# Patient Record
Sex: Female | Born: 1970 | Race: White | Hispanic: No | Smoking: Never smoker
Health system: Southern US, Community
[De-identification: ages and names within clinical notes are randomized; demographics above are authoritative.]

## PROBLEM LIST (undated history)

## (undated) DIAGNOSIS — I10 Essential (primary) hypertension: Secondary | ICD-10-CM

## (undated) DIAGNOSIS — E039 Hypothyroidism, unspecified: Secondary | ICD-10-CM

## (undated) DIAGNOSIS — M069 Rheumatoid arthritis, unspecified: Secondary | ICD-10-CM

## (undated) DIAGNOSIS — E119 Type 2 diabetes mellitus without complications: Secondary | ICD-10-CM

## (undated) HISTORY — DX: Type 2 diabetes mellitus without complications: E11.9

## (undated) HISTORY — DX: Hypothyroidism, unspecified: E03.9

## (undated) HISTORY — DX: Essential (primary) hypertension: I10

## (undated) HISTORY — DX: Rheumatoid arthritis, unspecified: M06.9

---

## 2011-10-27 ENCOUNTER — Ambulatory Visit: Payer: Self-pay | Admitting: Family Medicine

## 2011-10-27 DIAGNOSIS — E669 Obesity, unspecified: Secondary | ICD-10-CM

## 2011-10-27 DIAGNOSIS — E039 Hypothyroidism, unspecified: Secondary | ICD-10-CM

## 2011-10-27 DIAGNOSIS — F419 Anxiety disorder, unspecified: Secondary | ICD-10-CM | POA: Insufficient documentation

## 2011-10-27 DIAGNOSIS — I10 Essential (primary) hypertension: Secondary | ICD-10-CM

## 2011-10-27 DIAGNOSIS — G8929 Other chronic pain: Secondary | ICD-10-CM

## 2011-10-27 MED ORDER — ALPRAZOLAM 1 MG PO TABS: 1.0000 mg | ORAL_TABLET | Freq: Two times a day (BID) | ORAL | Status: DC | PRN

## 2011-10-27 MED ORDER — OXYCODONE HCL 30 MG PO TABS: 30.0000 mg | ORAL_TABLET | ORAL | Status: AC | PRN

## 2011-10-27 MED ORDER — LEVOTHYROXINE SODIUM 125 MCG PO TABS: 125.0000 ug | ORAL_TABLET | Freq: Every day | ORAL | Status: DC

## 2011-10-27 MED ORDER — LISINOPRIL 40 MG PO TABS: 40.0000 mg | ORAL_TABLET | Freq: Every day | ORAL | Status: DC

## 2011-10-27 NOTE — Progress Notes (Signed)
This is the first visit for this 41 yo divorced mother of three who is raising a step daughter who is physically disabled and has partial responsibility for a mentally disabled mother and two grandsons.  She continues to work at [employer] and has chronic back pain, anxiety, hypertension, thyroid replacement.  She is planning on going to a chronic pain clinic for which she is self referring in a month.  She needs a primary care facility for her other problems.  No acute problems at present.  We discussed her current situation.

## 2011-10-31 ENCOUNTER — Telehealth: Payer: Self-pay
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
## 2011-10-31 NOTE — Telephone Encounter (Signed)
Dr. Milus Glazier to decide.

## 2011-10-31 NOTE — Telephone Encounter (Signed)
.  [facility] PT WOULD LIKE TO SPEAK WITH SOMEONE REGARDING HER MEDICINE. PLEASE CALL [phone]

## 2011-11-01 MED ORDER — LISINOPRIL 40 MG PO TABS: 40.0000 mg | ORAL_TABLET | Freq: Every day | ORAL | Status: AC

## 2011-11-01 MED ORDER — LEVOTHYROXINE SODIUM 125 MCG PO TABS: 125.0000 ug | ORAL_TABLET | Freq: Every day | ORAL | Status: AC

## 2011-11-01 NOTE — Telephone Encounter (Signed)
She will have to go to the ER.  I will lose my license if I rewrite these controlled substances.

## 2011-11-01 NOTE — Telephone Encounter (Signed)
Also, Dr. Elbert Ewings ok'd patient could get Synthroid and Lisinopril--we just need to contact pharmacy.  Deny others.

## 2011-11-13 ENCOUNTER — Ambulatory Visit (INDEPENDENT_AMBULATORY_CARE_PROVIDER_SITE_OTHER): Payer: Self-pay | Admitting: Family Medicine

## 2011-11-13 VITALS — BP 124/78 | HR 106 | Temp 98.3°F | Resp 18 | Ht 66.5 in | Wt 291.0 lb

## 2011-11-13 DIAGNOSIS — J069 Acute upper respiratory infection, unspecified: Secondary | ICD-10-CM

## 2011-11-13 DIAGNOSIS — J019 Acute sinusitis, unspecified: Secondary | ICD-10-CM

## 2011-11-13 DIAGNOSIS — M549 Dorsalgia, unspecified: Secondary | ICD-10-CM

## 2011-11-13 MED ORDER — AMOXICILLIN 875 MG PO TABS: 875.0000 mg | ORAL_TABLET | Freq: Two times a day (BID) | ORAL | Status: AC
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
## 2011-11-21 ENCOUNTER — Other Ambulatory Visit: Payer: Self-pay

## 2011-11-21 NOTE — Telephone Encounter (Signed)
Spoke to patient. She says that must be what the refills on the bottle means. She said she wants to switch pharmacies.I told her she would need to have the pharmacy call the new pharmacy or finish out this script at the same pharmacy because we would not be calling in more xanax at this time for her.

## 2011-11-21 NOTE — Telephone Encounter (Signed)
Dr. Milus Glazier  Pt is requesting refill on ALPRAZolam Prudy Feeler) 1 MG tablet [facility] pharmacy - NEW

## 2011-11-21 NOTE — Telephone Encounter (Signed)
Left a message for patient to call back about her meds.

## 2011-11-21 NOTE — Telephone Encounter (Signed)
Per the chart, the patient was given Alprazolam 1 mg BID, #180 on 10/27/11.  That's a 90-day supply, so shouldn't need a refill.  What's up?

## 2011-12-10 ENCOUNTER — Ambulatory Visit: Payer: Self-pay | Admitting: Family Medicine

## 2011-12-10 VITALS — BP 131/84 | HR 94 | Temp 98.0°F | Resp 18 | Wt 296.0 lb

## 2011-12-10 DIAGNOSIS — M545 Low back pain, unspecified: Secondary | ICD-10-CM

## 2011-12-10 DIAGNOSIS — M549 Dorsalgia, unspecified: Secondary | ICD-10-CM

## 2011-12-10 MED ORDER — HYDROCODONE-ACETAMINOPHEN 7.5-750 MG PO TABS: 1.0000 | ORAL_TABLET | Freq: Three times a day (TID) | ORAL | Status: AC | PRN

## 2011-12-10 NOTE — Patient Instructions (Signed)
Call if you get your insurance early so I can arrange an earlier MRI

## 2011-12-10 NOTE — Progress Notes (Signed)
41 yo woman with chronic back pain, now trying to raise money for $2000 MRI, has not yet seen specialist.  Managing pain meds.  O:  NAD  Reflexes: 1+ both knees, AJ's  SLR:  Neg seated  Back: tender L%  A:  Chronic pain.  I explained that I cannot continue to refill the oxycodone.  I have agreed to fill vicodin for one month.

## 2011-12-12 ENCOUNTER — Telehealth: Payer: Self-pay

## 2011-12-12 DIAGNOSIS — G5620 Lesion of ulnar nerve, unspecified upper limb: Secondary | ICD-10-CM

## 2011-12-12 NOTE — Telephone Encounter (Signed)
Pt is needing to talk with dr Milus Glazier about her medication because the new one is not working they had to send her home from work  [employer] number [phone] dfb

## 2011-12-12 NOTE — Telephone Encounter (Signed)
SAYS SHE HAS CALLED TWICE AND HAS NOT HEARD FROM [place].  HER MEDICINE IS NOT WORKING AND SHE WANTS DR. L TO CALL HER BACK HIMSELF

## 2011-12-13 ENCOUNTER — Other Ambulatory Visit: Payer: Self-pay | Admitting: Family Medicine

## 2011-12-13 NOTE — Telephone Encounter (Signed)
PT STATES SHE ONLY WANT DR Kenyon Ana TO GIVE HER A CALL BACK REGARDING HER MEDICINE PLEASE CALL [phone]

## 2011-12-13 NOTE — Telephone Encounter (Signed)
DR. L   PATIENT STATES HER MEDS ARE NOT WORKING.  HER EMPLOYER HAS GIVEN HER TWO DAYS TO GET IT STRAIGHT OR SHE WILL LOSE HER JOB.  PLEASE CALL

## 2011-12-14 ENCOUNTER — Other Ambulatory Visit: Payer: Self-pay | Admitting: Family Medicine

## 2011-12-14 DIAGNOSIS — M549 Dorsalgia, unspecified: Secondary | ICD-10-CM

## 2011-12-14 NOTE — Telephone Encounter (Signed)
Please explain that I have been out straight with urgent care patients.  I have explained my position re: pain medicines twice at great length.  She will need to see a pain specialist for pain medications.  If she has other problems, please return for evaluation.  I just cannot prescribe the narcotics she is used to taking.

## 2011-12-14 NOTE — Telephone Encounter (Signed)
Pt  CB and stated that she called [facility] in [place] and they told her they would try to see her ASAP if we can send over her OV notes/records to them (and referral), so she would like to be referred there.

## 2011-12-14 NOTE — Telephone Encounter (Signed)
I have referred patient to pain clinic (now twice).

## 2011-12-14 NOTE — Telephone Encounter (Signed)
Called Lupita Leash and told her pt's message about [facility] in [place]. She will check there for referral.

## 2011-12-14 NOTE — Telephone Encounter (Signed)
Gave pt message from Dr L. She agrees to referral to pain specialist and asks please try to get her in ASAP bc she is about to lose her job. She can not go to the [facility] d/t being a past pt and switching doctors. Please have Referrals call her as soon as appt can be set up.

## 2011-12-15 NOTE — Telephone Encounter (Signed)
Pt would like for Dr L to write a rx for oxycodone she states that the her [facility] to [facility] [place] won't be able to make her an appt until next week and that they have her ppwork but asked her to call Dr L to write rx for oxycodone

## 2012-04-03 ENCOUNTER — Telehealth: Payer: Self-pay

## 2012-04-03 NOTE — Telephone Encounter (Signed)
Can not renew this for her per Dr Milus Glazier, patient has been advised several times, she is going to come in.

## 2012-04-03 NOTE — Telephone Encounter (Signed)
The patient called again to request refill of her alprazolam rx.  The patient stated the pharmacy sent over a fax and has not heard a response from [facility].  Please call the patient at [phone].

## 2012-04-03 NOTE — Telephone Encounter (Signed)
PT WOULD LIKE TO HAVE A REFILL ON XANAX. PT HAS ALREADY CONTACTED PHARMACY BUT SHE STATES THAT THEY FAXED A REQUEST TO [place]. [facility]# [phone] [facility]

## 2012-04-04 ENCOUNTER — Other Ambulatory Visit: Payer: Self-pay | Admitting: Physician Assistant

## 2012-04-04 MED ORDER — ALPRAZOLAM 1 MG PO TABS: 1.0000 mg | ORAL_TABLET | Freq: Two times a day (BID) | ORAL | Status: AC | PRN

## 2012-07-25 ENCOUNTER — Telehealth: Payer: Self-pay

## 2013-07-27 NOTE — Telephone Encounter (Signed)
error 

## 2017-01-05 ENCOUNTER — Other Ambulatory Visit: Payer: Self-pay | Admitting: Psychiatry

## 2017-01-05 DIAGNOSIS — M5137 Other intervertebral disc degeneration, lumbosacral region: Secondary | ICD-10-CM

## 2017-03-05 ENCOUNTER — Ambulatory Visit
Admission: RE | Admit: 2017-03-05 | Discharge: 2017-03-05 | Disposition: A | Discharge status: Home / Self Care | Payer: Self-pay | Source: Ambulatory Visit | Attending: Psychiatry | Admitting: Psychiatry

## 2017-03-05 ENCOUNTER — Inpatient Hospital Stay
Admission: RE | Admit: 2017-03-05 | Discharge: 2017-03-05 | Disposition: A | Discharge status: Home / Self Care | Payer: Self-pay | Source: Ambulatory Visit | Attending: Psychiatry | Admitting: Psychiatry

## 2017-03-05 ENCOUNTER — Other Ambulatory Visit: Payer: Self-pay | Admitting: Psychiatry

## 2017-03-05 DIAGNOSIS — R52 Pain, unspecified: Secondary | ICD-10-CM

## 2017-03-21 ENCOUNTER — Other Ambulatory Visit: Payer: Self-pay

## 2017-03-22 ENCOUNTER — Ambulatory Visit
Admission: RE | Admit: 2017-03-22 | Discharge: 2017-03-22 | Disposition: A | Discharge status: Home / Self Care | Payer: Medicaid Other | Source: Ambulatory Visit | Attending: Psychiatry | Admitting: Psychiatry

## 2017-03-22 DIAGNOSIS — M5137 Other intervertebral disc degeneration, lumbosacral region: Secondary | ICD-10-CM

## 2017-03-22 MED ORDER — IOPAMIDOL (ISOVUE-M 200) INJECTION 41%
1.0000 mL | Freq: Once | INTRAMUSCULAR | Status: AC
  Administered 2017-03-22: 1 mL via EPIDURAL

## 2017-03-22 MED ORDER — METHYLPREDNISOLONE ACETATE 40 MG/ML INJ SUSP (RADIOLOG
120.0000 mg | Freq: Once | INTRAMUSCULAR | Status: AC
  Administered 2017-03-22: 120 mg via EPIDURAL

## 2017-03-22 NOTE — Discharge Instructions (Signed)

## 2018-07-08 IMAGING — XA Imaging study
1 series · 1 of 1 positions shown · non-contrast
Comparison: none

CLINICAL DATA: Lumbosacral spondylosis without myelopathy. RIGHT
leg radicular symptoms.

[Series 1: ortho adipose · 1 of 1 slices shown]
[im 1/1]
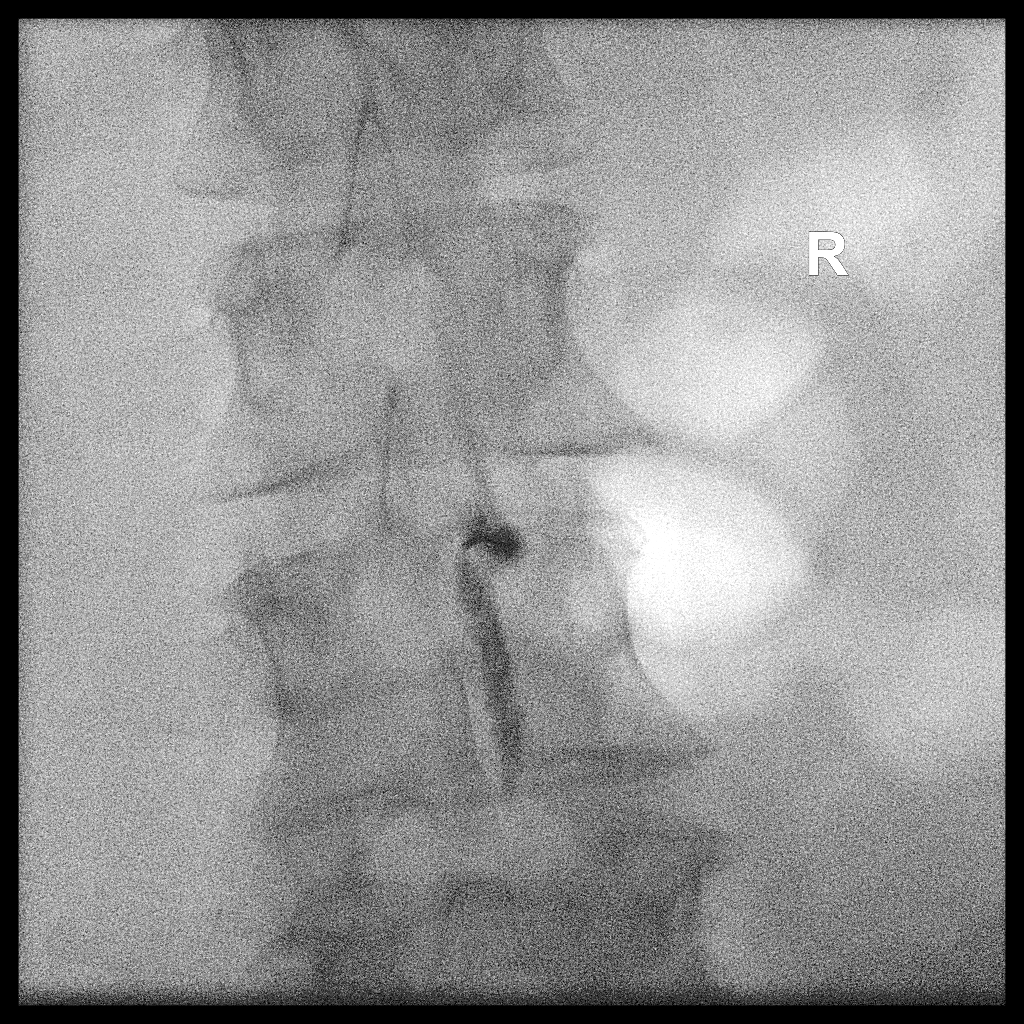

[1 of 1 positions shown; findings below may reference images not displayed]

FLUOROSCOPY TIME:  6 seconds corresponding to a Dose Area Product of
25.36 ?Gy*m2

PROCEDURE:
The procedure, risks, benefits, and alternatives were explained to
the patient. Questions regarding the procedure were encouraged and
answered. The patient understands and consents to the procedure.

LUMBAR EPIDURAL INJECTION:

An interlaminar approach was performed on RIGHT at L3-L4. The
overlying skin was cleansed and anesthetized. A 6 inch, 20 gauge
epidural needle was advanced using loss-of-resistance technique.

DIAGNOSTIC EPIDURAL INJECTION:

Injection of Isovue-M 200 shows a good epidural pattern with spread
above and below the level of needle placement, primarily on the
RIGHT; no vascular opacification is seen.

THERAPEUTIC EPIDURAL INJECTION:

120.0 Mg of Depo-Medrol mixed with 2 mL 1% lidocaine were instilled.
The procedure was well-tolerated, and the patient was discharged
thirty minutes following the injection in good condition.

COMPLICATIONS:
None.
IMPRESSION: Technically successful epidural injection on the RIGHT L3-4 # 1.

## 2020-11-04 DIAGNOSIS — I517 Cardiomegaly: Secondary | ICD-10-CM

## 2020-11-04 DIAGNOSIS — R0600 Dyspnea, unspecified: Secondary | ICD-10-CM

## 2022-02-08 DIAGNOSIS — M170 Bilateral primary osteoarthritis of knee: Secondary | ICD-10-CM | POA: Insufficient documentation

## 2022-02-08 DIAGNOSIS — E119 Type 2 diabetes mellitus without complications: Secondary | ICD-10-CM | POA: Insufficient documentation

## 2022-07-14 DIAGNOSIS — M0579 Rheumatoid arthritis with rheumatoid factor of multiple sites without organ or systems involvement: Secondary | ICD-10-CM | POA: Insufficient documentation

## 2022-07-14 DIAGNOSIS — Z79899 Other long term (current) drug therapy: Secondary | ICD-10-CM | POA: Insufficient documentation

## 2024-07-03 ENCOUNTER — Ambulatory Visit: Payer: PRIVATE HEALTH INSURANCE | Attending: Internal Medicine | Admitting: Internal Medicine

## 2024-07-03 ENCOUNTER — Encounter: Payer: Self-pay | Admitting: Internal Medicine

## 2024-07-03 VITALS — BP 125/74 | HR 85 | Temp 97.9°F | Ht 66.0 in | Wt 274.4 lb

## 2024-07-03 DIAGNOSIS — Z79899 Other long term (current) drug therapy: Secondary | ICD-10-CM | POA: Insufficient documentation

## 2024-07-03 DIAGNOSIS — M059 Rheumatoid arthritis with rheumatoid factor, unspecified: Secondary | ICD-10-CM

## 2024-07-03 DIAGNOSIS — M170 Bilateral primary osteoarthritis of knee: Secondary | ICD-10-CM | POA: Diagnosis not present

## 2024-07-03 DIAGNOSIS — I8393 Asymptomatic varicose veins of bilateral lower extremities: Secondary | ICD-10-CM | POA: Insufficient documentation

## 2024-07-03 DIAGNOSIS — M0579 Rheumatoid arthritis with rheumatoid factor of multiple sites without organ or systems involvement: Secondary | ICD-10-CM

## 2024-07-03 DIAGNOSIS — E559 Vitamin D deficiency, unspecified: Secondary | ICD-10-CM | POA: Insufficient documentation

## 2024-07-03 MED ORDER — METHOTREXATE SODIUM 2.5 MG PO TABS: 15.0000 mg | ORAL_TABLET | ORAL | 0 refills | Status: AC

## 2024-07-03 MED ORDER — FOLIC ACID 1 MG PO TABS: 1.0000 mg | ORAL_TABLET | Freq: Every day | ORAL | 0 refills | Status: AC
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
## 2024-07-06 LAB — CBC WITH DIFFERENTIAL/PLATELET
Absolute Lymphocytes: 1438 {cells}/uL (ref 850–3900)
Absolute Monocytes: 496 {cells}/uL (ref 200–950)
Basophils Absolute: 12 {cells}/uL (ref 0–200)
Basophils Relative: 0.2 %
Eosinophils Absolute: 68 {cells}/uL (ref 15–500)
Eosinophils Relative: 1.1 %
HCT: 33.3 % — ABNORMAL LOW (ref 35.0–45.0)
Hemoglobin: 9.5 g/dL — ABNORMAL LOW (ref 11.7–15.5)
MCH: 19.8 pg — ABNORMAL LOW (ref 27.0–33.0)
MCHC: 28.5 g/dL — ABNORMAL LOW (ref 32.0–36.0)
MCV: 69.4 fL — ABNORMAL LOW (ref 80.0–100.0)
MPV: 9.2 fL (ref 7.5–12.5)
Monocytes Relative: 8 %
Neutro Abs: 4185 {cells}/uL (ref 1500–7800)
Neutrophils Relative %: 67.5 %
Platelets: 348 Thousand/uL (ref 140–400)
RBC: 4.8 Million/uL (ref 3.80–5.10)
RDW: 17.6 % — ABNORMAL HIGH (ref 11.0–15.0)
Total Lymphocyte: 23.2 %
WBC: 6.2 Thousand/uL (ref 3.8–10.8)

## 2024-07-06 LAB — COMPREHENSIVE METABOLIC PANEL WITH GFR
AG Ratio: 1.2 (calc) (ref 1.0–2.5)
ALT: 10 U/L (ref 6–29)
AST: 12 U/L (ref 10–35)
Albumin: 3.8 g/dL (ref 3.6–5.1)
Alkaline phosphatase (APISO): 73 U/L (ref 37–153)
BUN: 13 mg/dL (ref 7–25)
CO2: 26 mmol/L (ref 20–32)
Calcium: 9.1 mg/dL (ref 8.6–10.4)
Chloride: 97 mmol/L — ABNORMAL LOW (ref 98–110)
Creat: 0.69 mg/dL (ref 0.50–1.03)
Globulin: 3.3 g/dL (ref 1.9–3.7)
Glucose, Bld: 410 mg/dL — ABNORMAL HIGH (ref 65–99)
Potassium: 4.1 mmol/L (ref 3.5–5.3)
Sodium: 135 mmol/L (ref 135–146)
Total Bilirubin: 0.6 mg/dL (ref 0.2–1.2)
Total Protein: 7.1 g/dL (ref 6.1–8.1)
eGFR: 103 mL/min/1.73m2 (ref >=60)

## 2024-07-06 LAB — C-REACTIVE PROTEIN: CRP: 30.4 mg/L — ABNORMAL HIGH (ref <8.0)

## 2024-07-06 LAB — QUANTIFERON-TB GOLD PLUS
Mitogen-NIL: 8.51 [IU]/mL
NIL: 0.01 [IU]/mL
QuantiFERON-TB Gold Plus: NEGATIVE
TB1-NIL: 0.02 [IU]/mL
TB2-NIL: 0.02 [IU]/mL

## 2024-07-06 LAB — SEDIMENTATION RATE: Sed Rate: 46 mm/h — ABNORMAL HIGH (ref 0–30)

## 2024-07-06 LAB — CBC MORPHOLOGY

## 2024-07-06 LAB — VITAMIN D 25 HYDROXY (VIT D DEFICIENCY, FRACTURES): Vit D, 25-Hydroxy: 20 ng/mL — ABNORMAL LOW (ref 30–100)
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
## 2024-09-08 ENCOUNTER — Ambulatory Visit: Payer: PRIVATE HEALTH INSURANCE | Admitting: Internal Medicine

## 2024-10-28 ENCOUNTER — Ambulatory Visit: Payer: PRIVATE HEALTH INSURANCE | Admitting: Internal Medicine
